# Patient Record
Sex: Male | Born: 1975 | Race: White | Hispanic: No | Marital: Married | Smoking: Never smoker
Health system: Southern US, Community
[De-identification: ages and names within clinical notes are randomized; demographics above are authoritative.]

## PROBLEM LIST (undated history)

## (undated) DIAGNOSIS — J302 Other seasonal allergic rhinitis: Secondary | ICD-10-CM

## (undated) DIAGNOSIS — I10 Essential (primary) hypertension: Secondary | ICD-10-CM

## (undated) DIAGNOSIS — K219 Gastro-esophageal reflux disease without esophagitis: Secondary | ICD-10-CM

## (undated) DIAGNOSIS — F32A Depression, unspecified: Secondary | ICD-10-CM

## (undated) DIAGNOSIS — F329 Major depressive disorder, single episode, unspecified: Secondary | ICD-10-CM

## (undated) DIAGNOSIS — S99199A Other physeal fracture of unspecified metatarsal, initial encounter for closed fracture: Secondary | ICD-10-CM

## (undated) DIAGNOSIS — G4730 Sleep apnea, unspecified: Secondary | ICD-10-CM

## (undated) HISTORY — PX: GROIN EXPLORATION: SHX1713

## (undated) HISTORY — PX: APPENDECTOMY: SHX54

---

## 2011-09-08 ENCOUNTER — Other Ambulatory Visit: Payer: Self-pay | Admitting: Urology

## 2011-09-08 ENCOUNTER — Other Ambulatory Visit (HOSPITAL_COMMUNITY)
Admission: RE | Admit: 2011-09-08 | Discharge: 2011-09-08 | Disposition: A | Discharge status: Home / Self Care | Payer: Self-pay | Source: Ambulatory Visit | Attending: Urology | Admitting: Urology

## 2011-09-08 DIAGNOSIS — Z9852 Vasectomy status: Secondary | ICD-10-CM | POA: Insufficient documentation

## 2011-09-08 HISTORY — PX: VASECTOMY: SHX75

## 2015-02-21 ENCOUNTER — Emergency Department (HOSPITAL_COMMUNITY)
Admission: EM | Admit: 2015-02-21 | Discharge: 2015-02-21 | Disposition: A | Discharge status: Home / Self Care | Payer: Worker's Compensation | Attending: Emergency Medicine | Admitting: Emergency Medicine

## 2015-02-21 ENCOUNTER — Emergency Department (HOSPITAL_COMMUNITY): Payer: Worker's Compensation

## 2015-02-21 ENCOUNTER — Encounter (HOSPITAL_COMMUNITY): Payer: Self-pay | Admitting: *Deleted

## 2015-02-21 DIAGNOSIS — X58XXXA Exposure to other specified factors, initial encounter: Secondary | ICD-10-CM | POA: Insufficient documentation

## 2015-02-21 DIAGNOSIS — S4992XA Unspecified injury of left shoulder and upper arm, initial encounter: Secondary | ICD-10-CM | POA: Insufficient documentation

## 2015-02-21 DIAGNOSIS — Y9289 Other specified places as the place of occurrence of the external cause: Secondary | ICD-10-CM | POA: Insufficient documentation

## 2015-02-21 DIAGNOSIS — Y998 Other external cause status: Secondary | ICD-10-CM | POA: Diagnosis not present

## 2015-02-21 DIAGNOSIS — Y9389 Activity, other specified: Secondary | ICD-10-CM | POA: Insufficient documentation

## 2015-02-21 MED ORDER — OXYCODONE-ACETAMINOPHEN 5-325 MG PO TABS
2.0000 | ORAL_TABLET | ORAL | Status: DC | PRN
Start: 2015-02-21 — End: 2016-02-05

## 2015-02-21 NOTE — ED Provider Notes (Signed)
CSN: [id number]     Arrival date & time 02/21/15  2204 History   First MD Initiated Contact with Patient 02/21/15 2234     No chief complaint on file.    (Consider location/radiation/quality/duration/timing/severity/associated sxs/prior Treatment) Patient is a 39 y.o. male presenting with shoulder injury. The history is provided by the patient.  Shoulder Injury The current episode started in the past 7 days. The problem occurs constantly.   Alex Vang is a 39 y.o. male who presents to the ED with left shoulder pain. Patient reports that on 3-21 he was at work and lifting heavy stuff and  "tore" his left shoulder. He went to the occupational health there at work and had x-rays and told to limit his use of the shoulder until follow up. He did follow up and was told to return to his normal duties.  Today he reached to pick up a piece of paper and felt it tear again. The pain is worse tonight.   History reviewed. No pertinent past medical history. Past Surgical History  Procedure Laterality Date  . Appendectomy    . Vasectomy     History reviewed. No pertinent family history. History  Substance Use Topics  . Smoking status: Never Smoker   . Smokeless tobacco: Not on file  . Alcohol Use: No    Review of Systems  Musculoskeletal:       Left shoulder pain  all other systems negative    Allergies  Review of patient's allergies indicates no known allergies.  Home Medications   Prior to Admission medications   Medication Sig Start Date End Date Taking? Authorizing Provider  oxyCODONE-acetaminophen (PERCOCET/ROXICET) 5-325 MG per tablet Take 2 tablets by mouth every 4 (four) hours as needed for severe pain. 02/21/15   Hope Orlene OchM Neese, NP   BP 154/99 mmHg  Pulse 100  Temp(Src) 99 F (37.2 C) (Oral)  Resp 18  Ht 6' (1.829 m)  Wt 270 lb (122.471 kg)  BMI 36.61 kg/m2  SpO2 99% Physical Exam  Constitutional: He is oriented to person, place, and time. He appears well-developed  and well-nourished.  HENT:  Head: Normocephalic.  Eyes: EOM are normal.  Neck: Normal range of motion. Neck supple.  Cardiovascular: Normal rate.   Pulmonary/Chest: Effort normal.  Abdominal: Soft.  Musculoskeletal:       Left shoulder: He exhibits tenderness and pain. He exhibits normal range of motion, no swelling, no crepitus, no deformity, normal pulse and normal strength.       Arms: Radial pulses 2+ bilateral, adequate circulation, good touch sensation. Pain with palpation of left deltoid and range of motion of left shoulder. No red flags for deltoid disruption.   Neurological: He is alert and oriented to person, place, and time. No cranial nerve deficit.  Skin: Skin is warm and dry.  Psychiatric: He has a normal mood and affect. His behavior is normal.  Nursing note and vitals reviewed.   ED Course  Procedures  MDM  [age] y.o. male with left shoulder pain s/p injury. Stable for d/c without signs of deltoid disruption. He will follow up with the doctor at his occupational health at work. He will return here as needed.   Final diagnoses:  Shoulder injury, left, initial encounter       Doris Miller [facility] Neese, NP 02/22/15 2246  Benjiman CoreNathan Pickering, MD 02/22/15 2329

## 2015-02-21 NOTE — ED Notes (Signed)
Discharge instructions given, pt demonstrated teach back and verbal understanding. No concerns voiced.  

## 2015-02-21 NOTE — ED Notes (Signed)
Pt "tore" his left shoulder on 02-16-15. Pt states tonight he felt his left shoulder tear again while reaching for some paper.

## 2015-04-10 ENCOUNTER — Ambulatory Visit (HOSPITAL_BASED_OUTPATIENT_CLINIC_OR_DEPARTMENT_OTHER): Payer: BLUE CROSS/BLUE SHIELD | Attending: Otolaryngology

## 2015-04-10 VITALS — Ht 72.0 in | Wt 280.0 lb

## 2015-04-10 DIAGNOSIS — I119 Hypertensive heart disease without heart failure: Secondary | ICD-10-CM | POA: Diagnosis not present

## 2015-04-26 DIAGNOSIS — I119 Hypertensive heart disease without heart failure: Secondary | ICD-10-CM | POA: Diagnosis not present

## 2015-04-26 NOTE — Sleep Study (Signed)
   NAME: Alex Vang DATE OF BIRTH:  04-Jan-1976 MEDICAL RECORD NUMBER [id number]  LOCATION: [facility]  PHYSICIAN: Joplin Canty D  DATE OF STUDY: 04/10/2015  SLEEP STUDY TYPE: Nocturnal Polysomnogram               REFERRING PHYSICIAN: Serena Colonelosen, Jefry, MD  INDICATION FOR STUDY: Hypersomnia with sleep apnea  EPWORTH SLEEPINESS SCORE:   14/24 HEIGHT: 6' (182.9 cm)  WEIGHT: 280 lb (127.007 kg)    Body mass index is 37.97 kg/(m^2).  NECK SIZE: 18 in.  MEDICATIONS: Charted for review  SLEEP ARCHITECTURE: Total sleep time 295.5 minutes with sleep efficiency 77.8%. Stage I was 21.7%, stage II 62.1%, stage III 16.2%, REM absent. Sleep latency 29 minutes, awake after sleep onset 58 minutes, arousal index 11.6, bedtime medication: None  RESPIRATORY DATA: Apnea hypopnea index (AHI) 25.8 per hour. 127 total events scored including 2 obstructive apneas and 125 hypopneas. Events were significantly all positions, especially supine. This study was ordered as a diagnostic polysomnogram without CPAP.  OXYGEN DATA: Very loud snoring with oxygen desaturation to a nadir of 81% and mean saturation 92.7% on room air  CARDIAC DATA: Normal sinus rhythm  MOVEMENT/PARASOMNIA: A few incidental limb jerks were noted with little effect on sleep. Bathroom 1  IMPRESSION/ RECOMMENDATION:   1) Moderate obstructive sleep apnea/hypopnea syndrome, AHI 25.8 per hour. Non-positional events, more common while supine. Very loud snoring with oxygen desaturation to a nadir of 81% and mean saturation 92.7% on room air. 2) This study was ordered as a diagnostic polysomnogram without CPAP 3) The patient works a rotating day/night shift schedule at two-week intervals.   Waymon BudgeYOUNG,Nakshatra Klose D Diplomate, American Board of Sleep Medicine  ELECTRONICALLY SIGNED ON:  04/26/2015, 9:30 AM [facility] PH: [phone]   FX: [phone] ACCREDITED BY THE AMERICAN ACADEMY OF SLEEP  MEDICINE

## 2015-05-13 ENCOUNTER — Ambulatory Visit (HOSPITAL_BASED_OUTPATIENT_CLINIC_OR_DEPARTMENT_OTHER): Payer: BLUE CROSS/BLUE SHIELD | Attending: Otolaryngology | Admitting: Radiology

## 2015-05-13 ENCOUNTER — Ambulatory Visit (HOSPITAL_BASED_OUTPATIENT_CLINIC_OR_DEPARTMENT_OTHER): Payer: BLUE CROSS/BLUE SHIELD

## 2015-05-13 VITALS — Ht 72.0 in | Wt 275.0 lb

## 2015-05-13 DIAGNOSIS — G4730 Sleep apnea, unspecified: Secondary | ICD-10-CM | POA: Insufficient documentation

## 2015-05-13 DIAGNOSIS — G4733 Obstructive sleep apnea (adult) (pediatric): Secondary | ICD-10-CM

## 2015-05-13 DIAGNOSIS — G4710 Hypersomnia, unspecified: Secondary | ICD-10-CM | POA: Insufficient documentation

## 2015-05-16 DIAGNOSIS — G4733 Obstructive sleep apnea (adult) (pediatric): Secondary | ICD-10-CM | POA: Diagnosis not present

## 2015-05-16 NOTE — Sleep Study (Signed)
   NAME: Alex Vang DATE OF BIRTH:  26-Aug-1976 MEDICAL RECORD NUMBER [id number]  LOCATION: [facility]  PHYSICIAN: YOUNG,CLINTON D  DATE OF STUDY: 05/13/2015  SLEEP STUDY TYPE: Nocturnal Polysomnogram-CPAP titration               REFERRING PHYSICIAN: Serena Colonel, MD  INDICATION FOR STUDY: Hypersomnia with sleep apnea  EPWORTH SLEEPINESS SCORE:   14/24 HEIGHT: 6' (182.9 cm)  WEIGHT: 275 lb (124.739 kg)    Body mass index is 37.29 kg/(m^2).  NECK SIZE: 18 in.  MEDICATIONS: Charted for review  SLEEP ARCHITECTURE: Total sleep time 336 minutes with sleep efficiency 85.9%. Stage I was 8.6%, stage II 89.4%, stage III 0.1%, REM 1.8% of total sleep time. Sleep latency 16.5 minutes, REM latency 326 minutes, awake after sleep onset 37.5 minutes, arousal index 6.3, bedtime medication: None  RESPIRATORY DATA: CPAP titration protocol. CPAP was titrated to 10 CWP, AHI 0.8 per hour. He wore a small fullface mask.  OXYGEN DATA: Snoring was prevented and mean oxygen saturation was 95.1% on room air.  CARDIAC DATA: Normal sinus rhythm  MOVEMENT/PARASOMNIA: No significant movement disturbance, no bathroom trips  IMPRESSION/ RECOMMENDATION:   1) Successful CPAP titration to 10 CWP, AHI 0.8 per hour. He wore a small F[employer]P Simplus fullface mask with heated humidifier. Snoring was prevented and mean oxygen saturation was 95.1% on room air. 2) Baseline polysomnogram on 04/10/2015 recorded AHI 25.8 per hour with body weight 280 pounds.   Waymon Budge Diplomate, American Board of Sleep Medicine  ELECTRONICALLY SIGNED ON:  05/16/2015, 11:08 AM [facility] PH: [phone]   FX: [phone] ACCREDITED BY THE AMERICAN ACADEMY OF SLEEP MEDICINE

## 2015-05-18 ENCOUNTER — Encounter (HOSPITAL_BASED_OUTPATIENT_CLINIC_OR_DEPARTMENT_OTHER): Payer: BLUE CROSS/BLUE SHIELD

## 2015-07-24 HISTORY — PX: SHOULDER ARTHROSCOPY: SHX128

## 2016-01-05 ENCOUNTER — Other Ambulatory Visit: Payer: Self-pay | Admitting: Sports Medicine

## 2016-01-05 DIAGNOSIS — S92355D Nondisplaced fracture of fifth metatarsal bone, left foot, subsequent encounter for fracture with routine healing: Secondary | ICD-10-CM

## 2016-01-08 ENCOUNTER — Ambulatory Visit
Admission: RE | Admit: 2016-01-08 | Discharge: 2016-01-08 | Disposition: A | Discharge status: Home / Self Care | Payer: BLUE CROSS/BLUE SHIELD | Source: Ambulatory Visit | Attending: Sports Medicine | Admitting: Sports Medicine

## 2016-01-08 DIAGNOSIS — S92355D Nondisplaced fracture of fifth metatarsal bone, left foot, subsequent encounter for fracture with routine healing: Secondary | ICD-10-CM

## 2016-01-21 ENCOUNTER — Other Ambulatory Visit: Payer: Self-pay | Admitting: Orthopedic Surgery

## 2016-01-27 DIAGNOSIS — S99199A Other physeal fracture of unspecified metatarsal, initial encounter for closed fracture: Secondary | ICD-10-CM

## 2016-01-27 HISTORY — DX: Other physeal fracture of unspecified metatarsal, initial encounter for closed fracture: S99.199A

## 2016-02-05 ENCOUNTER — Encounter (HOSPITAL_BASED_OUTPATIENT_CLINIC_OR_DEPARTMENT_OTHER): Payer: Self-pay | Admitting: *Deleted

## 2016-02-10 ENCOUNTER — Other Ambulatory Visit: Payer: Self-pay

## 2016-02-10 ENCOUNTER — Encounter (HOSPITAL_BASED_OUTPATIENT_CLINIC_OR_DEPARTMENT_OTHER)
Admission: RE | Admit: 2016-02-10 | Discharge: 2016-02-10 | Disposition: A | Discharge status: Home / Self Care | Payer: BLUE CROSS/BLUE SHIELD | Source: Ambulatory Visit | Attending: Orthopedic Surgery | Admitting: Orthopedic Surgery

## 2016-02-10 DIAGNOSIS — K219 Gastro-esophageal reflux disease without esophagitis: Secondary | ICD-10-CM | POA: Diagnosis not present

## 2016-02-10 DIAGNOSIS — E119 Type 2 diabetes mellitus without complications: Secondary | ICD-10-CM | POA: Diagnosis not present

## 2016-02-10 DIAGNOSIS — Z791 Long term (current) use of non-steroidal anti-inflammatories (NSAID): Secondary | ICD-10-CM | POA: Diagnosis not present

## 2016-02-10 DIAGNOSIS — I10 Essential (primary) hypertension: Secondary | ICD-10-CM | POA: Diagnosis not present

## 2016-02-10 DIAGNOSIS — X58XXXD Exposure to other specified factors, subsequent encounter: Secondary | ICD-10-CM | POA: Diagnosis not present

## 2016-02-10 DIAGNOSIS — G4730 Sleep apnea, unspecified: Secondary | ICD-10-CM | POA: Diagnosis not present

## 2016-02-10 DIAGNOSIS — F329 Major depressive disorder, single episode, unspecified: Secondary | ICD-10-CM | POA: Diagnosis not present

## 2016-02-10 DIAGNOSIS — S92352K Displaced fracture of fifth metatarsal bone, left foot, subsequent encounter for fracture with nonunion: Secondary | ICD-10-CM | POA: Diagnosis present

## 2016-02-10 DIAGNOSIS — Z6841 Body Mass Index (BMI) 40.0 and over, adult: Secondary | ICD-10-CM | POA: Diagnosis not present

## 2016-02-10 DIAGNOSIS — Z79899 Other long term (current) drug therapy: Secondary | ICD-10-CM | POA: Diagnosis not present

## 2016-02-10 LAB — BASIC METABOLIC PANEL
Anion gap: 12 (ref 5–15)
BUN: 11 mg/dL (ref 6–20)
CHLORIDE: 103 mmol/L (ref 101–111)
CO2: 26 mmol/L (ref 22–32)
CREATININE: 1 mg/dL (ref 0.61–1.24)
Calcium: 9.3 mg/dL (ref 8.9–10.3)
GFR calc Af Amer: >60 mL/min (ref >60)
GFR calc non Af Amer: >60 mL/min (ref >60)
Glucose, Bld: 163 mg/dL — ABNORMAL HIGH (ref 65–99)
Potassium: 4.2 mmol/L (ref 3.5–5.1)
SODIUM: 141 mmol/L (ref 135–145)

## 2016-02-11 ENCOUNTER — Encounter (HOSPITAL_BASED_OUTPATIENT_CLINIC_OR_DEPARTMENT_OTHER)
Admission: RE | Disposition: A | Discharge status: Home / Self Care | Payer: Self-pay | Source: Ambulatory Visit | Attending: Orthopedic Surgery

## 2016-02-11 ENCOUNTER — Encounter (HOSPITAL_BASED_OUTPATIENT_CLINIC_OR_DEPARTMENT_OTHER): Payer: Self-pay | Admitting: *Deleted

## 2016-02-11 ENCOUNTER — Ambulatory Visit (HOSPITAL_BASED_OUTPATIENT_CLINIC_OR_DEPARTMENT_OTHER)
Admission: RE | Admit: 2016-02-11 | Discharge: 2016-02-11 | Disposition: A | Discharge status: Home / Self Care | Payer: BLUE CROSS/BLUE SHIELD | Source: Ambulatory Visit | Attending: Orthopedic Surgery | Admitting: Orthopedic Surgery

## 2016-02-11 ENCOUNTER — Ambulatory Visit (HOSPITAL_BASED_OUTPATIENT_CLINIC_OR_DEPARTMENT_OTHER): Payer: BLUE CROSS/BLUE SHIELD | Admitting: Anesthesiology

## 2016-02-11 DIAGNOSIS — F329 Major depressive disorder, single episode, unspecified: Secondary | ICD-10-CM | POA: Insufficient documentation

## 2016-02-11 DIAGNOSIS — K219 Gastro-esophageal reflux disease without esophagitis: Secondary | ICD-10-CM | POA: Insufficient documentation

## 2016-02-11 DIAGNOSIS — Z79899 Other long term (current) drug therapy: Secondary | ICD-10-CM | POA: Insufficient documentation

## 2016-02-11 DIAGNOSIS — X58XXXD Exposure to other specified factors, subsequent encounter: Secondary | ICD-10-CM | POA: Insufficient documentation

## 2016-02-11 DIAGNOSIS — I10 Essential (primary) hypertension: Secondary | ICD-10-CM | POA: Insufficient documentation

## 2016-02-11 DIAGNOSIS — S92352K Displaced fracture of fifth metatarsal bone, left foot, subsequent encounter for fracture with nonunion: Secondary | ICD-10-CM | POA: Insufficient documentation

## 2016-02-11 DIAGNOSIS — Z791 Long term (current) use of non-steroidal anti-inflammatories (NSAID): Secondary | ICD-10-CM | POA: Insufficient documentation

## 2016-02-11 DIAGNOSIS — Z6841 Body Mass Index (BMI) 40.0 and over, adult: Secondary | ICD-10-CM | POA: Insufficient documentation

## 2016-02-11 DIAGNOSIS — E119 Type 2 diabetes mellitus without complications: Secondary | ICD-10-CM | POA: Insufficient documentation

## 2016-02-11 DIAGNOSIS — G4730 Sleep apnea, unspecified: Secondary | ICD-10-CM | POA: Insufficient documentation

## 2016-02-11 HISTORY — DX: Major depressive disorder, single episode, unspecified: F32.9

## 2016-02-11 HISTORY — DX: Essential (primary) hypertension: I10

## 2016-02-11 HISTORY — DX: Depression, unspecified: F32.A

## 2016-02-11 HISTORY — DX: Other seasonal allergic rhinitis: J30.2

## 2016-02-11 HISTORY — DX: Sleep apnea, unspecified: G47.30

## 2016-02-11 HISTORY — DX: Gastro-esophageal reflux disease without esophagitis: K21.9

## 2016-02-11 HISTORY — PX: ORIF TOE FRACTURE: SHX5032

## 2016-02-11 HISTORY — DX: Other physeal fracture of unspecified metatarsal, initial encounter for closed fracture: S99.199A

## 2016-02-11 SURGERY — OPEN REDUCTION INTERNAL FIXATION (ORIF) METATARSAL (TOE) FRACTURE
Anesthesia: General | Site: Foot | Laterality: Left

## 2016-02-11 MED ORDER — FENTANYL CITRATE (PF) 100 MCG/2ML IJ SOLN
INTRAMUSCULAR | Status: AC
  Filled 2016-02-11: qty 2

## 2016-02-11 MED ORDER — HYDROCODONE-ACETAMINOPHEN 5-325 MG PO TABS: 1.0000 | ORAL_TABLET | Freq: Four times a day (QID) | ORAL | Status: AC | PRN

## 2016-02-11 MED ORDER — PROPOFOL 10 MG/ML IV BOLUS
INTRAVENOUS | Status: AC
  Filled 2016-02-11: qty 20

## 2016-02-11 MED ORDER — OXYCODONE HCL 5 MG PO TABS
5.0000 mg | ORAL_TABLET | Freq: Once | ORAL | Status: AC | PRN
  Administered 2016-02-11: 5 mg via ORAL

## 2016-02-11 MED ORDER — MEPERIDINE HCL 25 MG/ML IJ SOLN: 6.2500 mg | INTRAMUSCULAR | Status: DC | PRN

## 2016-02-11 MED ORDER — HYDROMORPHONE HCL 1 MG/ML IJ SOLN: 0.2500 mg | INTRAMUSCULAR | Status: DC | PRN

## 2016-02-11 MED ORDER — SCOPOLAMINE 1 MG/3DAYS TD PT72
MEDICATED_PATCH | TRANSDERMAL | Status: AC
  Filled 2016-02-11: qty 1

## 2016-02-11 MED ORDER — CHLORHEXIDINE GLUCONATE 4 % EX LIQD: 60.0000 mL | Freq: Once | CUTANEOUS | Status: DC

## 2016-02-11 MED ORDER — MIDAZOLAM HCL 2 MG/2ML IJ SOLN
1.0000 mg | INTRAMUSCULAR | Status: DC | PRN
  Administered 2016-02-11: 2 mg via INTRAVENOUS

## 2016-02-11 MED ORDER — LACTATED RINGERS IV SOLN
INTRAVENOUS | Status: DC
  Administered 2016-02-11 (×2): via INTRAVENOUS

## 2016-02-11 MED ORDER — FENTANYL CITRATE (PF) 100 MCG/2ML IJ SOLN
50.0000 ug | INTRAMUSCULAR | Status: AC | PRN
  Administered 2016-02-11: 50 ug via INTRAVENOUS
  Administered 2016-02-11: 100 ug via INTRAVENOUS
  Administered 2016-02-11 (×2): 25 ug via INTRAVENOUS

## 2016-02-11 MED ORDER — SODIUM CHLORIDE 0.9 % IV SOLN: INTRAVENOUS | Status: DC

## 2016-02-11 MED ORDER — SCOPOLAMINE 1 MG/3DAYS TD PT72
1.0000 | MEDICATED_PATCH | Freq: Once | TRANSDERMAL | Status: DC | PRN
  Administered 2016-02-11: 1.5 mg via TRANSDERMAL

## 2016-02-11 MED ORDER — GLYCOPYRROLATE 0.2 MG/ML IJ SOLN: 0.2000 mg | Freq: Once | INTRAMUSCULAR | Status: DC | PRN

## 2016-02-11 MED ORDER — OXYCODONE HCL 5 MG/5ML PO SOLN: 5.0000 mg | Freq: Once | ORAL | Status: AC | PRN

## 2016-02-11 MED ORDER — PROPOFOL 10 MG/ML IV BOLUS
INTRAVENOUS | Status: DC | PRN
  Administered 2016-02-11: 200 mg via INTRAVENOUS

## 2016-02-11 MED ORDER — ONDANSETRON HCL 4 MG/2ML IJ SOLN
INTRAMUSCULAR | Status: AC
  Filled 2016-02-11: qty 2

## 2016-02-11 MED ORDER — CEFAZOLIN SODIUM-DEXTROSE 2-3 GM-% IV SOLR
INTRAVENOUS | Status: AC
  Filled 2016-02-11: qty 50

## 2016-02-11 MED ORDER — MIDAZOLAM HCL 2 MG/2ML IJ SOLN
INTRAMUSCULAR | Status: AC
  Filled 2016-02-11: qty 2

## 2016-02-11 MED ORDER — LIDOCAINE HCL (CARDIAC) 20 MG/ML IV SOLN
INTRAVENOUS | Status: DC | PRN
  Administered 2016-02-11: 60 mg via INTRAVENOUS

## 2016-02-11 MED ORDER — 0.9 % SODIUM CHLORIDE (POUR BTL) OPTIME
TOPICAL | Status: DC | PRN
  Administered 2016-02-11: 120 mL

## 2016-02-11 MED ORDER — LIDOCAINE HCL (CARDIAC) 20 MG/ML IV SOLN
INTRAVENOUS | Status: AC
  Filled 2016-02-11: qty 5

## 2016-02-11 MED ORDER — CEFAZOLIN SODIUM-DEXTROSE 2-3 GM-% IV SOLR: 2.0000 g | INTRAVENOUS | Status: DC

## 2016-02-11 MED ORDER — ONDANSETRON HCL 4 MG/2ML IJ SOLN
INTRAMUSCULAR | Status: DC | PRN
  Administered 2016-02-11: 4 mg via INTRAVENOUS

## 2016-02-11 MED ORDER — FENTANYL CITRATE (PF) 100 MCG/2ML IJ SOLN
100.0000 ug | INTRAMUSCULAR | Status: DC | PRN
  Administered 2016-02-11 (×2): 25 ug via INTRAVENOUS
  Administered 2016-02-11: 50 ug via INTRAVENOUS

## 2016-02-11 MED ORDER — OXYCODONE HCL 5 MG PO TABS
ORAL_TABLET | ORAL | Status: AC
  Filled 2016-02-11: qty 1

## 2016-02-11 MED ORDER — DEXTROSE 5 % IV SOLN
3.0000 g | INTRAVENOUS | Status: AC
  Administered 2016-02-11: 3 g via INTRAVENOUS

## 2016-02-11 MED ORDER — CEFAZOLIN SODIUM 1-5 GM-% IV SOLN
INTRAVENOUS | Status: AC
  Filled 2016-02-11: qty 50

## 2016-02-11 MED ORDER — DEXAMETHASONE SODIUM PHOSPHATE 10 MG/ML IJ SOLN
INTRAMUSCULAR | Status: DC | PRN
  Administered 2016-02-11: 10 mg via INTRAVENOUS

## 2016-02-11 MED ORDER — DEXAMETHASONE SODIUM PHOSPHATE 10 MG/ML IJ SOLN
INTRAMUSCULAR | Status: AC
  Filled 2016-02-11: qty 1

## 2016-02-11 SURGICAL SUPPLY — 72 items
BANDAGE ESMARK 6X9 LF (GAUZE/BANDAGES/DRESSINGS) ×1 IMPLANT
BIT DRILL CAN 3.5MM (BIT) ×1
BIT DRILL CANN 3.5 (BIT) ×1
BIT DRILL SRG 3.5XCAN FFTH MTR (BIT) ×1 IMPLANT
BIT DRL SRG 3.5XCANN FIFTH MTR (BIT) ×1
BLADE SURG 15 STRL LF DISP TIS (BLADE) ×2 IMPLANT
BLADE SURG 15 STRL SS (BLADE) ×4
BNDG COHESIVE 4X5 TAN STRL (GAUZE/BANDAGES/DRESSINGS) ×3 IMPLANT
BNDG COHESIVE 6X5 TAN STRL LF (GAUZE/BANDAGES/DRESSINGS) IMPLANT
BNDG CONFORM 2 STRL LF (GAUZE/BANDAGES/DRESSINGS) ×3 IMPLANT
BNDG ESMARK 4X9 LF (GAUZE/BANDAGES/DRESSINGS) IMPLANT
BNDG ESMARK 6X9 LF (GAUZE/BANDAGES/DRESSINGS) ×3
CANISTER SUCT 1200ML W/VALVE (MISCELLANEOUS) ×3 IMPLANT
CHLORAPREP W/TINT 26ML (MISCELLANEOUS) ×3 IMPLANT
COVER BACK TABLE 60X90IN (DRAPES) ×3 IMPLANT
CUFF TOURNIQUET SINGLE 34IN LL (TOURNIQUET CUFF) IMPLANT
CUFF TOURNIQUET SINGLE 44IN (TOURNIQUET CUFF) IMPLANT
DECANTER SPIKE VIAL GLASS SM (MISCELLANEOUS) IMPLANT
DRAPE EXTREMITY T 121X128X90 (DRAPE) ×3 IMPLANT
DRAPE OEC MINIVIEW 54X84 (DRAPES) ×3 IMPLANT
DRAPE U-SHAPE 47X51 STRL (DRAPES) ×3 IMPLANT
DRSG MEPITEL 4X7.2 (GAUZE/BANDAGES/DRESSINGS) ×3 IMPLANT
DRSG PAD ABDOMINAL 8X10 ST (GAUZE/BANDAGES/DRESSINGS) ×6 IMPLANT
ELECT REM PT RETURN 9FT ADLT (ELECTROSURGICAL) ×3
ELECTRODE REM PT RTRN 9FT ADLT (ELECTROSURGICAL) ×1 IMPLANT
GAUZE SPONGE 4X4 12PLY STRL (GAUZE/BANDAGES/DRESSINGS) ×3 IMPLANT
GLOVE BIO SURGEON STRL SZ8 (GLOVE) ×3 IMPLANT
GLOVE BIOGEL PI IND STRL 7.0 (GLOVE) ×1 IMPLANT
GLOVE BIOGEL PI IND STRL 8 (GLOVE) ×1 IMPLANT
GLOVE BIOGEL PI INDICATOR 7.0 (GLOVE) ×2
GLOVE BIOGEL PI INDICATOR 8 (GLOVE) ×2
GLOVE ECLIPSE 6.5 STRL STRAW (GLOVE) ×3 IMPLANT
GLOVE ECLIPSE 7.5 STRL STRAW (GLOVE) ×3 IMPLANT
GLOVE EXAM NITRILE MD LF STRL (GLOVE) IMPLANT
GOWN STRL REUS W/ TWL LRG LVL3 (GOWN DISPOSABLE) ×1 IMPLANT
GOWN STRL REUS W/ TWL XL LVL3 (GOWN DISPOSABLE) ×1 IMPLANT
GOWN STRL REUS W/TWL LRG LVL3 (GOWN DISPOSABLE) ×2
GOWN STRL REUS W/TWL XL LVL3 (GOWN DISPOSABLE) ×2
GUIDEWIRE .07X8 (WIRE) ×3 IMPLANT
NEEDLE HYPO 22GX1.5 SAFETY (NEEDLE) IMPLANT
NS IRRIG 1000ML POUR BTL (IV SOLUTION) ×3 IMPLANT
PACK BASIN DAY SURGERY FS (CUSTOM PROCEDURE TRAY) ×3 IMPLANT
PAD CAST 4YDX4 CTTN HI CHSV (CAST SUPPLIES) ×1 IMPLANT
PADDING CAST ABS 4INX4YD NS (CAST SUPPLIES)
PADDING CAST ABS COTTON 4X4 ST (CAST SUPPLIES) IMPLANT
PADDING CAST COTTON 4X4 STRL (CAST SUPPLIES) ×2
PADDING CAST COTTON 6X4 STRL (CAST SUPPLIES) ×3 IMPLANT
PENCIL BUTTON HOLSTER BLD 10FT (ELECTRODE) ×3 IMPLANT
SANITIZER HAND PURELL 535ML FO (MISCELLANEOUS) ×3 IMPLANT
SCREW 5.5MMX45MM (Screw) ×3 IMPLANT
SHEET MEDIUM DRAPE 40X70 STRL (DRAPES) ×3 IMPLANT
SLEEVE SCD COMPRESS KNEE MED (MISCELLANEOUS) ×3 IMPLANT
SPLINT FAST PLASTER 5X30 (CAST SUPPLIES) ×40
SPLINT PLASTER CAST FAST 5X30 (CAST SUPPLIES) ×20 IMPLANT
SPONGE LAP 18X18 X RAY DECT (DISPOSABLE) ×3 IMPLANT
STOCKINETTE 6  STRL (DRAPES) ×2
STOCKINETTE 6 STRL (DRAPES) ×1 IMPLANT
SUCTION FRAZIER HANDLE 10FR (MISCELLANEOUS) ×2
SUCTION TUBE FRAZIER 10FR DISP (MISCELLANEOUS) ×1 IMPLANT
SUT ETHILON 3 0 PS 1 (SUTURE) ×3 IMPLANT
SUT FIBERWIRE #2 38 T-5 BLUE (SUTURE)
SUT MNCRL AB 3-0 PS2 18 (SUTURE) ×3 IMPLANT
SUT VIC AB 0 SH 27 (SUTURE) IMPLANT
SUT VIC AB 2-0 SH 27 (SUTURE)
SUT VIC AB 2-0 SH 27XBRD (SUTURE) IMPLANT
SUTURE FIBERWR #2 38 T-5 BLUE (SUTURE) IMPLANT
SYR BULB 3OZ (MISCELLANEOUS) ×3 IMPLANT
SYR CONTROL 10ML LL (SYRINGE) IMPLANT
TOWEL OR 17X24 6PK STRL BLUE (TOWEL DISPOSABLE) ×6 IMPLANT
TUBE CONNECTING 20'X1/4 (TUBING) ×1
TUBE CONNECTING 20X1/4 (TUBING) ×2 IMPLANT
UNDERPAD 30X30 (UNDERPADS AND DIAPERS) ×3 IMPLANT

## 2016-02-11 NOTE — Anesthesia Procedure Notes (Addendum)
Procedure Name: LMA Insertion Date/Time: 02/11/2016 1:33 PM Performed by: Burna CashONRAD, Nadirah Socorro C Pre-anesthesia Checklist: Patient identified, Emergency Drugs available, Suction available and Patient being monitored Patient Re-evaluated:Patient Re-evaluated prior to inductionOxygen Delivery Method: Circle System Utilized Preoxygenation: Pre-oxygenation with 100% oxygen Intubation Type: IV induction Ventilation: Mask ventilation without difficulty LMA: LMA inserted LMA Size: 5.0 Number of attempts: 1 Airway Equipment and Method: Bite block Placement Confirmation: positive ETCO2 Tube secured with: Tape Dental Injury: Teeth and Oropharynx as per pre-operative assessment     Ankle Block:  Prep of Left ankle.  22g SBN used for block of the Lateral side of the Left ankle. 20  ml of 0.5% Marcaine given incrementally. Pt tolerated well.

## 2016-02-11 NOTE — Brief Op Note (Signed)
02/11/2016  2:25 PM  PATIENT:  Kerney ElbeKenneth E Chappuis  40 y.o. male  PRE-OPERATIVE DIAGNOSIS:  left fifth metatarsal jones fracture nonunion  POST-OPERATIVE DIAGNOSIS:  left fifth metatarsal jones fracture nonunion  Procedure(s): 1.  OPEN TREATMENT LEFT FIFTH METATARSAL FRACTURE NON UNION 2. AP, lateral and oblique radiographs of the left foot  SURGEON:  Toni ArthursJohn Daliyah Sramek, MD  ASSISTANT: n/a  ANESTHESIA:   General, regional  EBL:  minimal   TOURNIQUET:  n/a  COMPLICATIONS:  None apparent  DISPOSITION:  Extubated, awake and stable to recovery.  DICTATION ID:  [id number]

## 2016-02-11 NOTE — Progress Notes (Signed)
Assisted Dr. Crews with left, ankle block. Side rails up, monitors on throughout procedure. See vital signs in flow sheet. Tolerated Procedure well. 

## 2016-02-11 NOTE — Discharge Instructions (Addendum)
Toni Arthurs, MD [facility]  Please read the following information regarding your care after surgery.  Medications  You only need a prescription for the narcotic pain medicine (ex. oxycodone, Percocet, Norco).  All of the other medicines listed below are available over the counter. X norco as prescribed for severe pain.  Narcotic pain medicine (ex. oxycodone, Percocet, Vicodin) will cause constipation.  To prevent this problem, take the following medicines while you are taking any pain medicine. X docusate sodium (Colace) 100 mg twice a day     X senna (Senokot) 2 tablets twice a day  X To help prevent blood clots, take a baby aspirin (81 mg) twice a day for two weeks after surgery.  You should also get up every hour while you are awake to move around.    Weight Bearing ? Bear weight when you are able on your operated leg or foot. ? Bear weight only on the heel of your operated foot in the post-op shoe. X Do not bear any weight on the operated leg or foot.  Cast / Splint / Dressing X Keep your splint or cast clean and dry.  Dont put anything (coat hanger, pencil, etc) down inside of it.  If it gets damp, use a hair dryer on the cool setting to dry it.  If it gets soaked, call the office to schedule an appointment for a cast change. ? Remove your dressing 3 days after surgery and cover the incisions with dry dressings.    After your dressing, cast or splint is removed; you may shower, but do not soak or scrub the wound.  Allow the water to run over it, and then gently pat it dry.  Swelling It is normal for you to have swelling where you had surgery.  To reduce swelling and pain, keep your toes above your nose for at least 3 days after surgery.  It may be necessary to keep your foot or leg elevated for several weeks.  If it hurts, it should be elevated.  Follow Up Call my office at [phone] when you are discharged from the hospital or surgery center to schedule an  appointment to be seen two weeks after surgery.  Call my office at [phone] if you develop a fever >101.5 F, nausea, vomiting, bleeding from the surgical site or severe pain.      Regional Anesthesia Blocks  1. Numbness or the inability to move the "blocked" extremity may last from 3-48 hours after placement. The length of time depends on the medication injected and your individual response to the medication. If the numbness is not going away after 48 hours, call your surgeon.  2. The extremity that is blocked will need to be protected until the numbness is gone and the  Strength has returned. Because you cannot feel it, you will need to take extra care to avoid injury. Because it may be weak, you may have difficulty moving it or using it. You may not know what position it is in without looking at it while the block is in effect.  3. For blocks in the legs and feet, returning to weight bearing and walking needs to be done carefully. You will need to wait until the numbness is entirely gone and the strength has returned. You should be able to move your leg and foot normally before you try and bear weight or walk. You will need someone to be with you when you first try to ensure you do not fall  and possibly risk injury.  4. Bruising and tenderness at the needle site are common side effects and will resolve in a few days.  5. Persistent numbness or new problems with movement should be communicated to the surgeon or the [facility] [phone])/ [facility] [phone]).      Post Anesthesia Home Care Instructions  Activity: Get plenty of rest for the remainder of the day. A responsible adult should stay with you for 24 hours following the procedure.  For the next 24 hours, DO NOT: -Drive a car -Advertising copywriterperate machinery -Drink alcoholic beverages -Take any medication unless instructed by your physician -Make any legal decisions or sign important  papers.  Meals: Start with liquid foods such as gelatin or soup. Progress to regular foods as tolerated. Avoid greasy, spicy, heavy foods. If nausea and/or vomiting occur, drink only clear liquids until the nausea and/or vomiting subsides. Call your physician if vomiting continues.  Special Instructions/Symptoms: Your throat may feel dry or sore from the anesthesia or the breathing tube placed in your throat during surgery. If this causes discomfort, gargle with warm salt water. The discomfort should disappear within 24 hours.  If you had a scopolamine patch placed behind your ear for the management of post- operative nausea and/or vomiting:  1. The medication in the patch is effective for 72 hours, after which it should be removed.  Wrap patch in a tissue and discard in the trash. Wash hands thoroughly with soap and water. 2. You may remove the patch earlier than 72 hours if you experience unpleasant side effects which may include dry mouth, dizziness or visual disturbances. 3. Avoid touching the patch. Wash your hands with soap and water after contact with the patch.

## 2016-02-11 NOTE — H&P (Signed)
Alex Vang is an 40 y.o. male.   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  He has failed non op treatment and presents today for surgical correction.  Past Medical History  Diagnosis Date  . GERD (gastroesophageal reflux disease)     Alka-Seltzer chews as needed  . Sleep apnea     uses CPAP nightly  . Depression   . Hypertension     states under control with meds., has been on med. x "long time"  . Seasonal allergies   . 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 Vasectomy  09/08/2011  . Shoulder arthroscopy Left 07/24/2015  . Appendectomy      as a child  . Groin exploration      History reviewed. No pertinent family history. Social History:  reports that he has never smoked. He has never used smokeless tobacco. He reports that he does not drink alcohol or use illicit drugs.  Allergies:  Allergies  Allergen Reactions  . Percocet [Oxycodone-Acetaminophen] Itching  . Dilaudid [Hydromorphone Hcl] Other (See Comments)    "makes me angry"    Medications Prior to Admission  Medication Sig Dispense Refill  . acetaminophen (TYLENOL) 325 MG tablet Take 650 mg by mouth every 6 (six) hours as needed.    . ALPRAZolam (XANAX) 0.5 MG tablet Take 0.5 mg by mouth at bedtime as needed for anxiety.    . benazepril-hydrochlorthiazide (LOTENSIN HCT) 20-12.5 MG tablet Take 1 tablet by mouth daily.    Marland Kitchen diltiazem (CARDIZEM CD) 120 MG 24 hr capsule Take 120 mg by mouth daily.    . naproxen (NAPRELAN) 500 MG 24 hr tablet Take 500 mg by mouth daily with breakfast.    . Vilazodone HCl (VIIBRYD) 40 MG TABS Take 40 mg by mouth daily.      Results for orders placed or performed during the hospital encounter of 02/11/16 (from the past 48 hour(s))  Basic metabolic panel     Status: Abnormal   Collection Time: 02/10/16  2:10 PM  Result Value Ref Range   Sodium 141 135  - 145 mmol/L   Potassium 4.2 3.5 - 5.1 mmol/L   Chloride 103 101 - 111 mmol/L   CO2 26 22 - 32 mmol/L   Glucose, Bld 163 (H) 65 - 99 mg/dL   BUN 11 6 - 20 mg/dL   Creatinine, Ser 1.00 0.61 - 1.24 mg/dL   Calcium 9.3 8.9 - 10.3 mg/dL   GFR calc non Af Amer >60 >60 mL/min   GFR calc Af Amer >60 >60 mL/min    Comment: (NOTE) The eGFR has been calculated using the CKD EPI equation. This calculation has not been validated in all clinical situations. eGFR's persistently <60 mL/min signify possible Chronic Kidney Disease.    Anion gap 12 5 - 15   No results found.  ROS  No recent f/c/n/v/wt loss  Blood pressure 131/92, pulse 88, temperature 98.1 F (36.7 C), temperature source Oral, resp. rate 12, height 6' (1.829 m), weight 134.888 kg (297 lb 6 oz), SpO2 100 %. Physical Exam  wn wd male in nad.  A and O x 4.  Mood and affect normal.  EOMI.  resp unlabored.  L foot with swelling laterally.  Skin healthy and intact.  No lymphadenopathy.  5/5 strength in PF  And DF.  Sens to LT  intact in the sural n dist.  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  The risks and benefits of the alternative treatment options have been discussed in detail.  The patient wishes to proceed with surgery and specifically understands risks of bleeding, infection, nerve damage, blood clots, need for additional surgery, amputation and death.   Wylene Simmer, MD 2016/02/24, 1:02 PM

## 2016-02-11 NOTE — Op Note (Signed)
NAME:  Alex Vang, Alex Vang NO.:  [id number]  MEDICAL RECORD NO.:  [id number]  LOCATION:                                 FACILITY:  PHYSICIAN:  Alex Arthurs, MD             DATE OF BIRTH:  DATE OF PROCEDURE:  02/11/2016 DATE OF DISCHARGE:                              OPERATIVE REPORT   PREOPERATIVE DIAGNOSIS:  Left fifth metatarsal Jones fracture nonunion.  POSTOPERATIVE DIAGNOSIS:  Left fifth metatarsal Jones fracture nonunion.  PROCEDURE: 1. Open treatment of left fifth metatarsal Jones fracture nonunion. 2. AP, lateral, and oblique radiographs of the left foot.  SURGEON:  Alex Arthurs, MD.  ANESTHESIA:  General, regional.  ESTIMATED BLOOD LOSS:  Minimal.  TOURNIQUET TIME:  Zero.  COMPLICATIONS:  None apparent.  DISPOSITION:  Extubated, awake, and stable to recovery.  INDICATION FOR PROCEDURE:  The patient is a 40 year old male with past medical history significant for diabetes.  He fractured his fifth metatarsal several months ago.  He has gone on to nonunion that is painful and limiting his activities.  He presents now for operative treatment of this injury.  He understands the risks and benefits, the alternative treatment options, and elects surgical treatment.  He specifically understands risks of bleeding, infection, nerve damage, blood clots, need for additional surgery, continued pain, nonunion, amputation, and death.  PROCEDURE IN DETAIL:  After preoperative consent was obtained and the correct operative site was identified, the patient was brought to the operating room and placed supine on the operating table.  General anesthesia was induced.  Preoperative antibiotics were administered. Surgical time-out was taken.  Left lower extremity was prepped and draped in standard sterile fashion.  A longitudinal incision was then made at the base of the fifth metatarsal.  Sharp dissection was carried down through skin and subcutaneous tissue.  The  peroneus brevis tendon was identified.  It was retracted and protected throughout the case.  A K-wire for the Arthrex Jones fracture screw set was inserted at the high and inside position at the base of the fifth metatarsal.  It was driven across the fracture site and into the medullary canal of the shaft of the fifth metatarsal.  AP, oblique, and lateral radiographs confirmed appropriate position of the K-wire.  The K-wire was then used to over drill and then the canal was tapped to a diameter of 5.5 mm.  A 45 mm long by 5.5 mm in diameter solid Arthrex Jones fracture screw was then inserted after removing the K-wire.  The screw was advanced across the fracture site.  It was noted to compress the fracture site appropriately and had excellent purchase.  AP, oblique, and lateral radiographs of the left foot were then obtained showing appropriate reduction of the fracture and appropriate position and length of the screw.  The wound was irrigated copiously.  Inverted simple sutures of 3-0 Monocryl were used to close the subcutaneous tissues, and the skin was closed with 3-0 nylon.  Sterile dressings were applied followed by a well-padded short- leg splint.  The patient was awakened from anesthesia and transported to the recovery room in stable condition.  FOLLOWUP PLAN:  The patient will be nonweightbearing on the left lower extremity.  He will follow up with me in the office in 2 weeks for suture removal and possible conversion to a CAM walker boot.  He will take aspirin for DVT prophylaxis.  X-rays AP, oblique, and lateral radiographs of the left foot were obtained intraoperatively.  These show interval reduction and intramedullary fixation of the left fifth metatarsal nonunion. Fractures appropriately positioned and appropriately reduced, the screw was appropriately positioned and of the appropriate length.     Alex ArthursJohn Masato Pettie, MD     JH/MEDQ  D:  02/11/2016  T:  02/11/2016  Job:   [id number]

## 2016-02-11 NOTE — Transfer of Care (Signed)
Immediate Anesthesia Transfer of Care Note  Patient: Alex Vang  Procedure(s) Performed: Procedure(s): OPEN TREATMENT LEFT FIFTH METATARSAL (TOE) FRACTURE NON UNION (Left)  Patient Location: PACU  Anesthesia Type:GA combined with regional for post-op pain  Level of Consciousness: sedated  Airway & Oxygen Therapy: Patient Spontanous Breathing and Patient connected to face mask oxygen  Post-op Assessment: Report given to RN and Post -op Vital signs reviewed and stable  Post vital signs: Reviewed and stable  Last Vitals:  Filed Vitals:   02/11/16 1310 02/11/16 1418  BP:  134/84  Pulse: 74 85  Temp:    Resp: 15     Complications: No apparent anesthesia complications

## 2016-02-11 NOTE — Anesthesia Preprocedure Evaluation (Signed)
Anesthesia Evaluation  Patient identified by MRN, date of birth, ID band Patient awake    Reviewed: Allergy & Precautions, NPO status , Patient's Chart, lab work & pertinent test results  Airway Mallampati: I  TM Distance: >3 FB Neck ROM: Full    Dental  (+) Teeth Intact, Dental Advisory Given   Pulmonary sleep apnea and Continuous Positive Airway Pressure Ventilation ,    breath sounds clear to auscultation       Cardiovascular hypertension, Pt. on medications  Rhythm:Regular Rate:Normal     Neuro/Psych    GI/Hepatic GERD  Medicated and Controlled,  Endo/Other  Morbid obesity  Renal/GU      Musculoskeletal   Abdominal   Peds  Hematology   Anesthesia Other Findings   Reproductive/Obstetrics                             Anesthesia Physical Anesthesia Plan  ASA: III  Anesthesia Plan: General   Post-op Pain Management: MAC Combined w/ Regional for Post-op pain   Induction: Intravenous  Airway Management Planned: LMA  Additional Equipment:   Intra-op Plan:   Post-operative Plan: Extubation in OR  Informed Consent: I have reviewed the patients History and Physical, chart, labs and discussed the procedure including the risks, benefits and alternatives for the proposed anesthesia with the patient or authorized representative who has indicated his/her understanding and acceptance.   Dental advisory given  Plan Discussed with: CRNA, Anesthesiologist and Surgeon  Anesthesia Plan Comments:         Anesthesia Quick Evaluation

## 2016-02-12 ENCOUNTER — Encounter (HOSPITAL_BASED_OUTPATIENT_CLINIC_OR_DEPARTMENT_OTHER): Payer: Self-pay | Admitting: Orthopedic Surgery

## 2016-02-13 NOTE — Anesthesia Postprocedure Evaluation (Signed)
Anesthesia Post Note  Patient: Alex ElbeKenneth E Vang  Procedure(s) Performed: Procedure(s) (LRB): OPEN TREATMENT LEFT FIFTH METATARSAL (TOE) FRACTURE NON UNION (Left)  Patient location during evaluation: PACU Anesthesia Type: General Level of consciousness: awake and alert Pain management: pain level controlled Vital Signs Assessment: post-procedure vital signs reviewed and stable Respiratory status: spontaneous breathing, nonlabored ventilation and respiratory function stable Cardiovascular status: blood pressure returned to baseline and stable Postop Assessment: no signs of nausea or vomiting Anesthetic complications: no    Last Vitals:  Filed Vitals:   02/11/16 1530 02/11/16 1640  BP: 132/82 140/89  Pulse: 92 97  Temp:  36.4 C  Resp: 18 18    Last Pain:  Filed Vitals:   02/12/16 0923  PainSc: 3                  Caled[place] Zou A

## 2018-01-12 IMAGING — CT CT FOOT*L* W/O CM
5 series · 17 of 34 positions shown, 18 images · non-contrast
Comparison: Radiographs dated 10/07/2015

CLINICAL DATA: Persistent foot pain since a fracture of the base of
the fifth metatarsal in September 2015.

EXAM:
CT OF THE LEFT FOOT WITHOUT CONTRAST
TECHNIQUE: Multidetector CT imaging of the left foot was performed according to
the standard protocol. Multiplanar CT image reconstructions were
also generated.

[Series 5: lower ext soft · axial · 0.57mm/px · z∈[-7,+33]mm · 2 of 50 slices shown]
[im 17/50  soft-tissue]
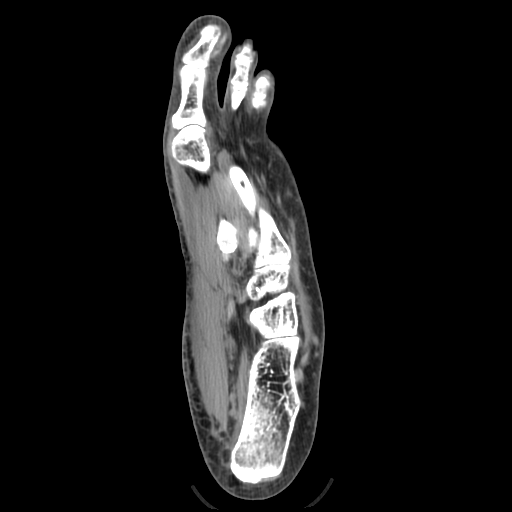
[im 33/50  soft-tissue]
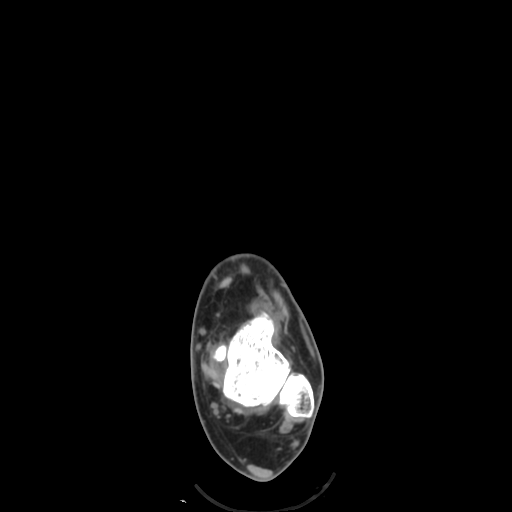

[Series 300: sag soft · sagittal · 0.57mm/px · 6 of 59 slices shown]
[im 20/59  bone]
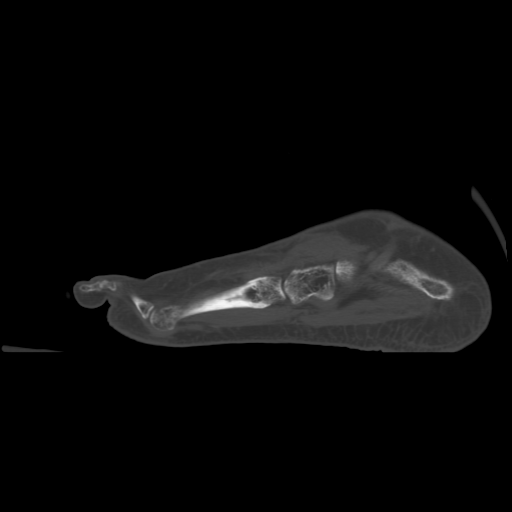
[im 25/59  bone]
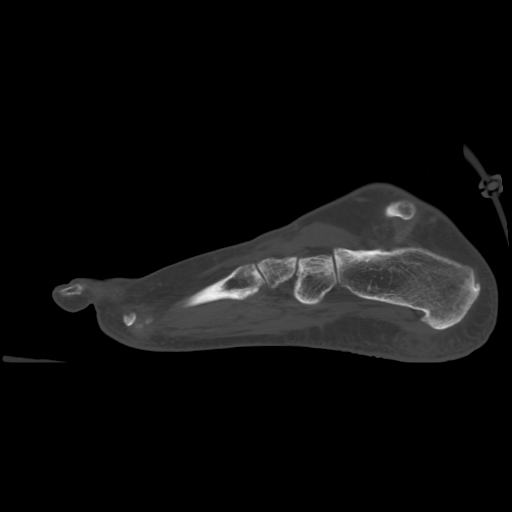
[im 29/59  soft-tissue]
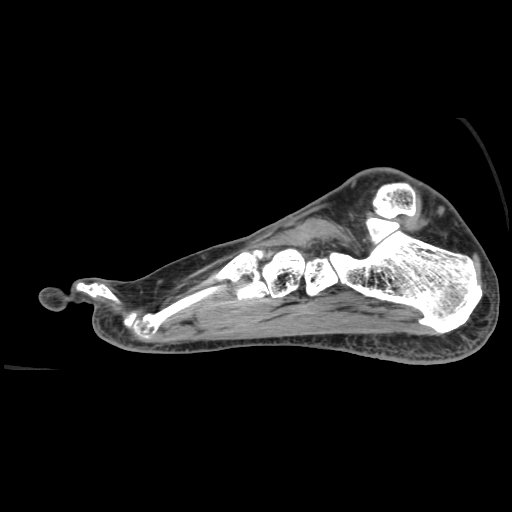
[im 30/59  bone]
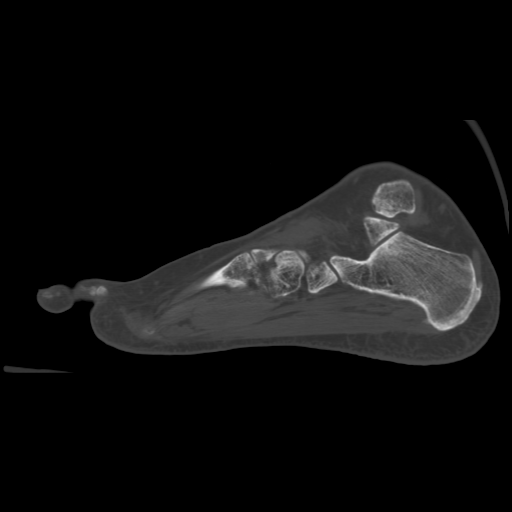
[im 34/59  bone]
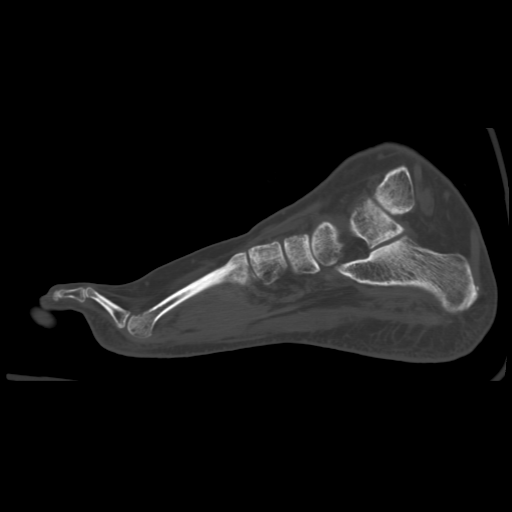
[im 39/59  bone]
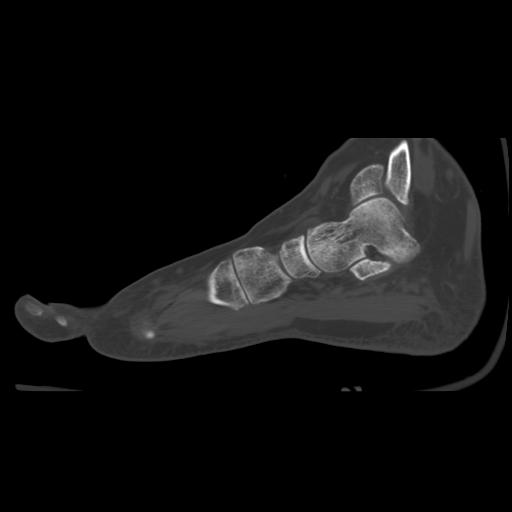

[Series 301: cor soft · axial · 0.57mm/px · z∈[+35,+79]mm · 3 of 50 slices shown]
[im 13/50  soft-tissue]
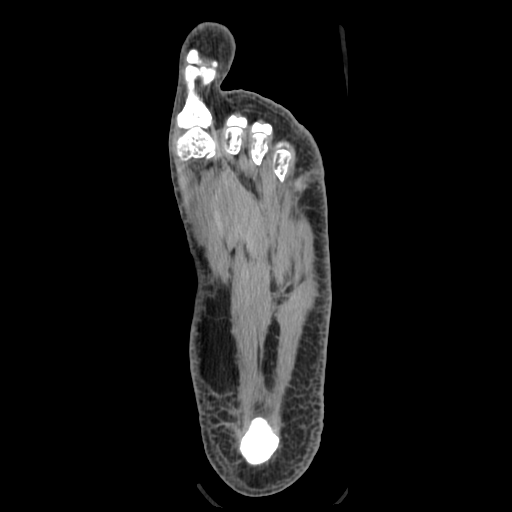
[im 25/50  soft-tissue]
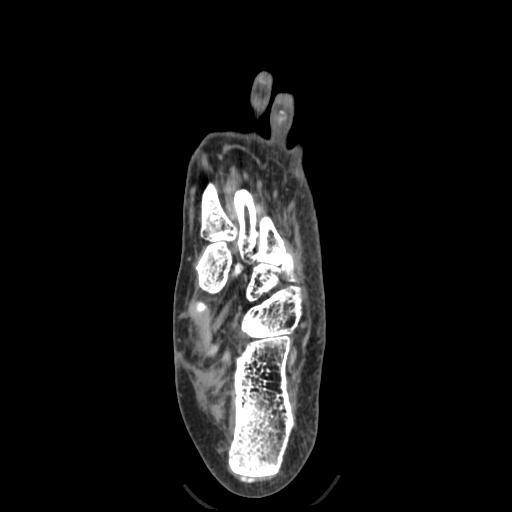
[im 37/50  soft-tissue]
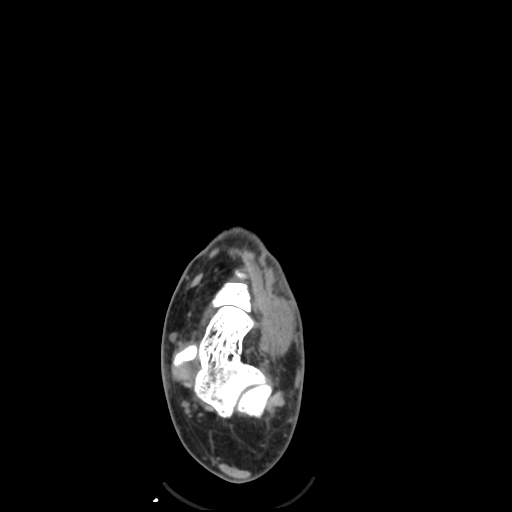

[Series 302: cor bone · axial · 0.57mm/px · z∈[+35,+79]mm · 3 of 50 slices shown, 4 images]
[im 13/50  soft-tissue]
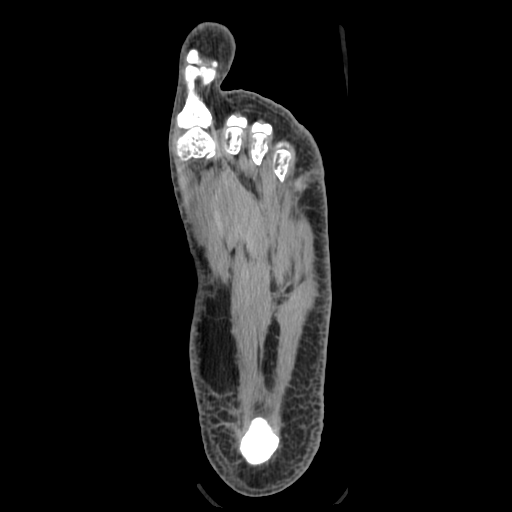
[im 13/50  bone]
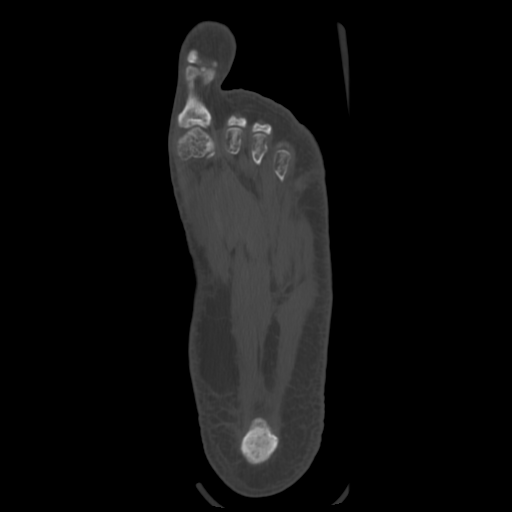
[im 25/50  bone]
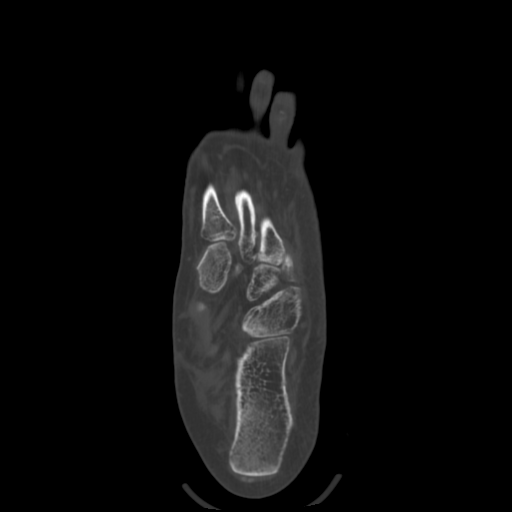
[im 37/50  bone]
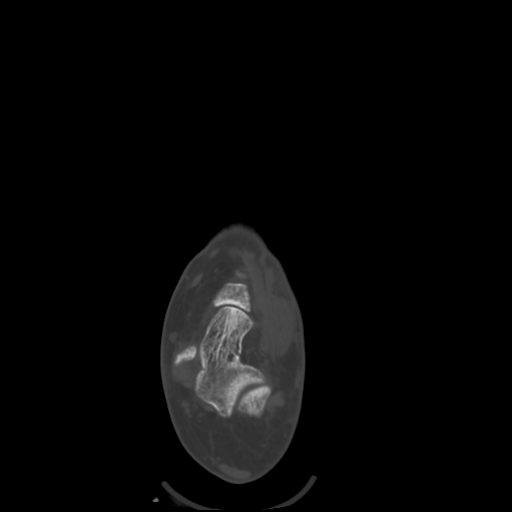

[Series 303: long axis soft · coronal · 0.57mm/px · 3 of 136 slices shown]
[im 28/136  bone]
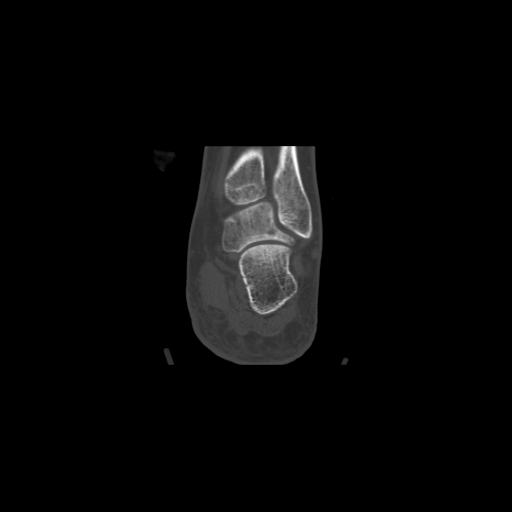
[im 55/136  bone]
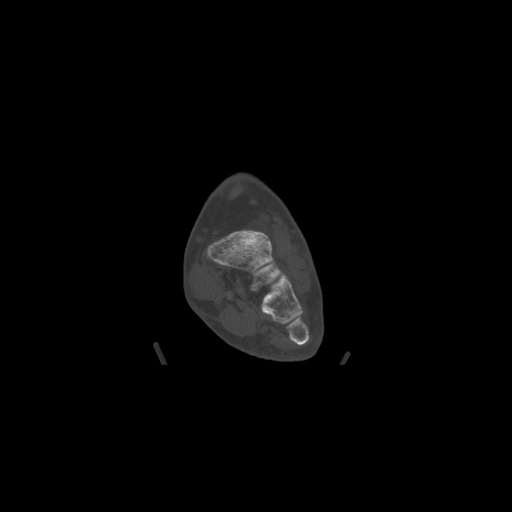
[im 82/136  bone]
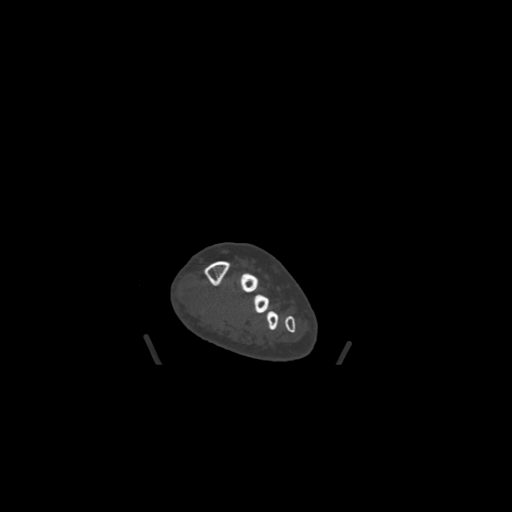

[17 of 34 positions shown; findings below may reference images not displayed]

FINDINGS: There is nonunion of the nondisplaced transverse fracture through
the base of the fifth metatarsal. There is patchy osteopenia
throughout the bones of the foot and ankle. Soft tissues appear
normal.
IMPRESSION: Nonunion of the fracture of the base of the fifth metatarsal. No
angulation or displacement.
